# Patient Record
Sex: Female | Born: 1980
Health system: Southern US, Community
[De-identification: ages and names within clinical notes are randomized; demographics above are authoritative.]

---

## 2004-11-08 ENCOUNTER — Emergency Department: Payer: Self-pay | Admitting: Emergency Medicine

## 2005-02-15 ENCOUNTER — Emergency Department: Payer: Self-pay | Admitting: Emergency Medicine

## 2005-10-09 ENCOUNTER — Emergency Department: Payer: Self-pay | Admitting: General Practice

## 2006-10-31 ENCOUNTER — Emergency Department: Payer: Self-pay | Admitting: General Practice

## 2007-07-30 ENCOUNTER — Emergency Department: Payer: Self-pay | Admitting: Emergency Medicine

## 2008-01-07 ENCOUNTER — Emergency Department: Payer: Self-pay | Admitting: Internal Medicine

## 2010-08-08 ENCOUNTER — Emergency Department: Payer: Self-pay | Admitting: Emergency Medicine

## 2012-05-24 ENCOUNTER — Emergency Department: Payer: Self-pay | Admitting: *Deleted

## 2012-05-24 LAB — COMPREHENSIVE METABOLIC PANEL
Albumin: 4 g/dL (ref 3.4–5.0)
Alkaline Phosphatase: 76 U/L (ref 50–136)
Anion Gap: 7 (ref 7–16)
Bilirubin,Total: 0.2 mg/dL (ref 0.2–1.0)
Chloride: 106 mmol/L (ref 98–107)
EGFR (African American): >60
EGFR (Non-African Amer.): >60
SGPT (ALT): 18 U/L (ref 12–78)
Total Protein: 7.6 g/dL (ref 6.4–8.2)

## 2012-05-24 LAB — URINALYSIS, COMPLETE
Bacteria: NONE SEEN
Bilirubin,UR: NEGATIVE
Glucose,UR: NEGATIVE mg/dL
Ketone: NEGATIVE
Nitrite: NEGATIVE
Ph: 5
Protein: NEGATIVE
RBC,UR: 4 /HPF
Specific Gravity: 1.02
Squamous Epithelial: 4
WBC UR: 2 /HPF

## 2012-05-24 LAB — PREGNANCY, URINE: Pregnancy Test, Urine: NEGATIVE m[IU]/mL

## 2012-05-24 LAB — LIPASE, BLOOD: Lipase: 121 U/L

## 2012-05-24 LAB — CBC
HCT: 40.7 % (ref 35.0–47.0)
HGB: 13.5 g/dL (ref 12.0–16.0)
MCH: 29.3 pg (ref 26.0–34.0)
MCHC: 33.2 g/dL (ref 32.0–36.0)
MCV: 88 fL (ref 80–100)
RBC: 4.62 10*6/uL (ref 3.80–5.20)
RDW: 13.3 % (ref 11.5–14.5)

## 2013-03-19 ENCOUNTER — Emergency Department: Payer: Self-pay | Admitting: Emergency Medicine

## 2014-01-12 ENCOUNTER — Emergency Department: Payer: Self-pay | Admitting: Emergency Medicine

## 2014-01-13 ENCOUNTER — Emergency Department: Payer: Self-pay | Admitting: Emergency Medicine
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
## 2016-10-20 ENCOUNTER — Encounter: Payer: Self-pay | Admitting: Emergency Medicine

## 2016-10-20 ENCOUNTER — Emergency Department
Admission: EM | Admit: 2016-10-20 | Discharge: 2016-10-20 | Disposition: A | Discharge status: Home / Self Care | Payer: No Typology Code available for payment source | Attending: Emergency Medicine | Admitting: Emergency Medicine

## 2016-10-20 DIAGNOSIS — Y92410 Unspecified street and highway as the place of occurrence of the external cause: Secondary | ICD-10-CM | POA: Insufficient documentation

## 2016-10-20 DIAGNOSIS — S199XXA Unspecified injury of neck, initial encounter: Secondary | ICD-10-CM | POA: Diagnosis present

## 2016-10-20 DIAGNOSIS — S161XXA Strain of muscle, fascia and tendon at neck level, initial encounter: Secondary | ICD-10-CM

## 2016-10-20 DIAGNOSIS — Y939 Activity, unspecified: Secondary | ICD-10-CM | POA: Insufficient documentation

## 2016-10-20 DIAGNOSIS — Y999 Unspecified external cause status: Secondary | ICD-10-CM | POA: Diagnosis not present

## 2016-10-20 MED ORDER — CYCLOBENZAPRINE HCL 10 MG PO TABS: 10.0000 mg | ORAL_TABLET | Freq: Three times a day (TID) | ORAL | 0 refills | Status: DC | PRN

## 2016-10-20 MED ORDER — IBUPROFEN 600 MG PO TABS: 600.0000 mg | ORAL_TABLET | Freq: Three times a day (TID) | ORAL | 0 refills | Status: DC | PRN

## 2016-10-20 MED ORDER — TRAMADOL HCL 50 MG PO TABS: 50.0000 mg | ORAL_TABLET | Freq: Four times a day (QID) | ORAL | 0 refills | Status: DC | PRN
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
## 2017-04-06 ENCOUNTER — Emergency Department
Admission: EM | Admit: 2017-04-06 | Discharge: 2017-04-06 | Disposition: A | Discharge status: Home / Self Care | Payer: Medicaid Other | Attending: Emergency Medicine | Admitting: Emergency Medicine

## 2017-04-06 ENCOUNTER — Encounter: Payer: Self-pay | Admitting: Emergency Medicine

## 2017-04-06 ENCOUNTER — Emergency Department: Payer: Medicaid Other

## 2017-04-06 DIAGNOSIS — R0789 Other chest pain: Secondary | ICD-10-CM | POA: Diagnosis not present

## 2017-04-06 DIAGNOSIS — Z79899 Other long term (current) drug therapy: Secondary | ICD-10-CM | POA: Diagnosis not present

## 2017-04-06 DIAGNOSIS — F410 Panic disorder [episodic paroxysmal anxiety] without agoraphobia: Secondary | ICD-10-CM | POA: Diagnosis not present

## 2017-04-06 DIAGNOSIS — R079 Chest pain, unspecified: Secondary | ICD-10-CM | POA: Diagnosis present

## 2017-04-06 LAB — BASIC METABOLIC PANEL
ANION GAP: 8 (ref 5–15)
BUN: 15 mg/dL (ref 6–20)
CALCIUM: 9.2 mg/dL (ref 8.9–10.3)
CO2: 23 mmol/L (ref 22–32)
CREATININE: 0.7 mg/dL (ref 0.44–1.00)
Chloride: 104 mmol/L (ref 101–111)
GFR calc non Af Amer: >60 mL/min (ref >60)
Glucose, Bld: 105 mg/dL — ABNORMAL HIGH (ref 65–99)
Potassium: 3.3 mmol/L — ABNORMAL LOW (ref 3.5–5.1)
Sodium: 135 mmol/L (ref 135–145)

## 2017-04-06 LAB — CBC
HCT: 40 % (ref 35.0–47.0)
Hemoglobin: 13.5 g/dL (ref 12.0–16.0)
MCH: 29.7 pg (ref 26.0–34.0)
MCHC: 33.7 g/dL (ref 32.0–36.0)
MCV: 88.1 fL (ref 80.0–100.0)
PLATELETS: 282 10*3/uL (ref 150–440)
RBC: 4.55 MIL/uL (ref 3.80–5.20)
RDW: 13.4 % (ref 11.5–14.5)
WBC: 8.5 10*3/uL (ref 3.6–11.0)

## 2017-04-06 LAB — TROPONIN I

## 2017-04-06 MED ORDER — LORAZEPAM 1 MG PO TABS
1.0000 mg | ORAL_TABLET | Freq: Once | ORAL | Status: AC
  Administered 2017-04-06: 1 mg via ORAL

## 2017-04-06 MED ORDER — LORAZEPAM 1 MG PO TABS
ORAL_TABLET | ORAL | Status: AC
  Filled 2017-04-06: qty 1

## 2017-04-06 MED ORDER — LORAZEPAM 1 MG PO TABS: 1.0000 mg | ORAL_TABLET | Freq: Three times a day (TID) | ORAL | 0 refills | Status: AC | PRN
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
## 2017-05-16 ENCOUNTER — Emergency Department
Admission: EM | Admit: 2017-05-16 | Discharge: 2017-05-16 | Disposition: A | Discharge status: Home / Self Care | Payer: Medicaid Other | Attending: Emergency Medicine | Admitting: Emergency Medicine

## 2017-05-16 ENCOUNTER — Emergency Department: Payer: Medicaid Other

## 2017-05-16 ENCOUNTER — Encounter: Payer: Self-pay | Admitting: *Deleted

## 2017-05-16 DIAGNOSIS — M5441 Lumbago with sciatica, right side: Secondary | ICD-10-CM | POA: Insufficient documentation

## 2017-05-16 DIAGNOSIS — M545 Low back pain: Secondary | ICD-10-CM | POA: Diagnosis present

## 2017-05-16 LAB — URINALYSIS, COMPLETE (UACMP) WITH MICROSCOPIC
BACTERIA UA: NONE SEEN
Bilirubin Urine: NEGATIVE
GLUCOSE, UA: NEGATIVE mg/dL
KETONES UR: NEGATIVE mg/dL
Leukocytes, UA: NEGATIVE
Nitrite: NEGATIVE
PROTEIN: NEGATIVE mg/dL
Specific Gravity, Urine: 1.01 (ref 1.005–1.030)
pH: 7 (ref 5.0–8.0)

## 2017-05-16 LAB — POCT PREGNANCY, URINE: PREG TEST UR: NEGATIVE

## 2017-05-16 MED ORDER — DIAZEPAM 2 MG PO TABS
2.0000 mg | ORAL_TABLET | Freq: Once | ORAL | Status: AC
  Administered 2017-05-16: 2 mg via ORAL
  Filled 2017-05-16: qty 1

## 2017-05-16 MED ORDER — TRAMADOL HCL 50 MG PO TABS
50.0000 mg | ORAL_TABLET | Freq: Once | ORAL | Status: AC
  Administered 2017-05-16: 50 mg via ORAL
  Filled 2017-05-16: qty 1

## 2017-05-16 MED ORDER — KETOROLAC TROMETHAMINE 60 MG/2ML IM SOLN
60.0000 mg | Freq: Once | INTRAMUSCULAR | Status: AC
  Administered 2017-05-16: 60 mg via INTRAMUSCULAR
  Filled 2017-05-16: qty 2

## 2017-05-16 MED ORDER — CYCLOBENZAPRINE HCL 5 MG PO TABS: 5.0000 mg | ORAL_TABLET | Freq: Three times a day (TID) | ORAL | 0 refills | Status: DC | PRN

## 2017-05-16 MED ORDER — LIDOCAINE 5 % EX PTCH
1.0000 | MEDICATED_PATCH | CUTANEOUS | Status: DC
  Administered 2017-05-16: 1 via TRANSDERMAL
  Filled 2017-05-16: qty 1

## 2017-05-16 MED ORDER — ACETAMINOPHEN 325 MG PO TABS
650.0000 mg | ORAL_TABLET | Freq: Once | ORAL | Status: AC
  Administered 2017-05-16: 650 mg via ORAL
  Filled 2017-05-16: qty 2

## 2017-05-16 MED ORDER — NAPROXEN 375 MG PO TABS: 375.0000 mg | ORAL_TABLET | Freq: Two times a day (BID) | ORAL | 0 refills | Status: DC

## 2017-05-16 NOTE — ED Triage Notes (Signed)
Pt complains of low back pain radiating down right buttock, pt denies injury, pain started last night

## 2017-05-16 NOTE — ED Notes (Signed)
Pt resting in bed, resp even and unlabored, family at bedside 

## 2017-05-16 NOTE — ED Provider Notes (Signed)
-----------------------------------------   10:03 AM on 05/16/2017 -----------------------------------------  Patient in no acute distress, neurovascular intact, side at 3 this morning by Dr. Zenda AlpersWebster. Delay care as the patient could not give us a urine sample for pregnancy test prior to x-ray. X-ray is negative exam is reassuring There is no evidence of cauda equina syndrome nothing to suggest that the patient has her for an abdominal pain, acute neurologic deficit, there are no "red flags" for this back pain to suggest any other acute pathology that requires emergent evaluation. Patient feels much more comfortable at this time. We'll discharge her with pain medications, plus follow-up. Return precautions and follow-up given and understood. According to Dr. Leone PayorWebster's plan, urinalysis and x-ray is negative she is to be discharged home. We will do so.   Jeanmarie PlantMcShane, Liany Mumpower A, MD 05/16/17 1005

## 2017-05-16 NOTE — ED Notes (Signed)
pts ride home at bedside

## 2017-05-16 NOTE — ED Provider Notes (Signed)
[facility] Emergency Department Provider Note   ____________________________________________   First MD Initiated Contact with Patient 05/16/17 [phone]     (approximate)  I have reviewed the triage vital signs and the nursing notes.   HISTORY  Chief Complaint Back Pain    HPI Terri Gonzalez is a 36 y.o. female who comes into the hospital today with some back pain.The patient reports that she's had constant pain on and off for 2 weeks. She reports that 2 weeks ago she was bending down to sweep up some dust on the floor and she pulled her back trying to stand up. She reports that since then she's been walking really slow and have a lot of pain whenever she tries to drive her car move or turn. She started taking a muscle relaxer but she reports is not helping. A neighbor gave her a pain pill and it was the only thing that helps her to sleep. The patient was at the beach this weekend and reports that she did okay but the pain came back worse last night. The patient has been unable to sleep. She isn't seen anyone has been trying to do some exercises at home. She is been using icy hot as well. The patient denies any incontinence or urinary retention. She is here today for evaluation. The patient states that the pain is in her bilateral and it goes down to her right lower extremity.   History reviewed. No pertinent past medical history.  There are no active problems to display for this patient.   History reviewed. No pertinent surgical history.  Prior to Admission medications   Medication Sig Start Date End Date Taking? Authorizing Provider  cyclobenzaprine (FLEXERIL) 10 MG tablet Take 1 tablet (10 mg total) by mouth 3 (three) times daily as needed. Patient not taking: Reported on 05/16/2017 10/20/16   Joni ReiningSmith, Ronald K, PA-C  ibuprofen (ADVIL,MOTRIN) 600 MG tablet Take 1 tablet (600 mg total) by mouth every 8 (eight) hours as needed. Patient not taking: Reported on  05/16/2017 10/20/16   Joni ReiningSmith, Ronald K, PA-C  LORazepam (ATIVAN) 1 MG tablet Take 1 tablet (1 mg total) by mouth every 8 (eight) hours as needed for anxiety. Patient not taking: Reported on 05/16/2017 04/06/17 04/06/18  Merrily Brittleifenbark, Neil, MD  traMADol (ULTRAM) 50 MG tablet Take 1 tablet (50 mg total) by mouth every 6 (six) hours as needed. Patient not taking: Reported on 05/16/2017 10/20/16 10/20/17  Joni ReiningSmith, Ronald K, PA-C    Allergies Patient has no known allergies.  No family history on file.  Social History Social History  Substance Use Topics  . Smoking status: Never Smoker  . Smokeless tobacco: Never Used  . Alcohol use Yes    Review of Systems  Constitutional: No fever/chills Eyes: No visual changes. ENT: No sore throat. Cardiovascular: Denies chest pain. Respiratory: Denies shortness of breath. Gastrointestinal: No abdominal pain.  No nausea, no vomiting.  No diarrhea.  No constipation. Genitourinary: Negative for dysuria. Musculoskeletal:  back pain. Skin: Negative for rash. Neurological: Negative for headaches, focal weakness or numbness.   ____________________________________________   PHYSICAL EXAM:  VITAL SIGNS: ED Triage Vitals  Enc Vitals Group     BP 05/16/17 0555 113/85     Pulse Rate 05/16/17 0555 62     Resp 05/16/17 0555 18     Temp 05/16/17 0555 (!) 97.5 F (36.4 C)     Temp Source 05/16/17 0555 Oral     SpO2 05/16/17 0555 100 %  Weight 05/16/17 0550 150 lb (68 kg)     Height 05/16/17 0550 5\' 7"  (1.702 m)     Head Circumference --      Peak Flow --      Pain Score 05/16/17 0550 9     Pain Loc --      Pain Edu? --      Excl. in GC? --     Constitutional: Alert and oriented. Well appearing and in Moderate distress. Eyes: Conjunctivae are normal. PERRL. EOMI. Head: Atraumatic. Nose: No congestion/rhinnorhea. Mouth/Throat: Mucous membranes are moist.  Oropharynx non-erythematous. Cardiovascular: Normal rate, regular rhythm. Grossly normal heart  sounds.  Good peripheral circulation. Respiratory: Normal respiratory effort.  No retractions. Lungs CTAB. Gastrointestinal: Soft and nontender. No distention. Positive bowel sounds Musculoskeletal: Tenderness to palpation of right low back with some positive straight leg raise.   Neurologic:  Normal speech and language.  Skin:  Skin is warm, dry and intact.  Psychiatric: Mood and affect are normal.   ____________________________________________   LABS (all labs ordered are listed, but only abnormal results are displayed)  Labs Reviewed  URINALYSIS, COMPLETE (UACMP) WITH MICROSCOPIC  POC URINE PREG, ED   ____________________________________________  EKG  none ____________________________________________  RADIOLOGY  No results found.  ____________________________________________   PROCEDURES  Procedure(s) performed: None  Procedures  Critical Care performed: No  ____________________________________________   INITIAL IMPRESSION / ASSESSMENT AND PLAN / ED COURSE  Pertinent labs & imaging results that were available during my care of the patient were reviewed by me and considered in my medical decision making (see chart for details).  This is a 36 year old female who comes into the hospital today with back pain. The patient has pain in her butt cheek that goes down her leg. It sounds that the patient has sciatica. I will give the patient a Lidoderm patch, shot of Toradol, Tylenol, Valium and tramadol. I will also send the patient for an x-ray. The patient's care will be signed out to Dr. Alphonzo LemmingsMcShane who will follow-up the results of the x-ray and disposition the patient.      ____________________________________________   FINAL CLINICAL IMPRESSION(S) / ED DIAGNOSES  Final diagnoses:  Acute right-sided low back pain with right-sided sciatica      NEW MEDICATIONS STARTED DURING THIS VISIT:  New Prescriptions   No medications on file     Note:  This document  was prepared using Dragon voice recognition software and may include unintentional dictation errors.    Rebecka ApleyWebster, Vallory Oetken P, MD 05/16/17 [phone]

## 2017-05-27 ENCOUNTER — Emergency Department
Admission: EM | Admit: 2017-05-27 | Discharge: 2017-05-27 | Disposition: A | Discharge status: Home / Self Care | Payer: Medicaid Other | Attending: Student in an Organized Health Care Education/Training Program | Admitting: Student in an Organized Health Care Education/Training Program

## 2017-05-27 ENCOUNTER — Encounter: Payer: Self-pay | Admitting: Emergency Medicine

## 2017-05-27 DIAGNOSIS — M5431 Sciatica, right side: Secondary | ICD-10-CM | POA: Diagnosis not present

## 2017-05-27 DIAGNOSIS — M25551 Pain in right hip: Secondary | ICD-10-CM | POA: Diagnosis present

## 2017-05-27 MED ORDER — PREDNISONE 10 MG (21) PO TBPK: ORAL_TABLET | ORAL | 0 refills | Status: DC

## 2017-05-27 MED ORDER — TRAMADOL HCL 50 MG PO TABS: 50.0000 mg | ORAL_TABLET | Freq: Four times a day (QID) | ORAL | 0 refills | Status: DC | PRN

## 2017-05-27 MED ORDER — METHOCARBAMOL 750 MG PO TABS: 750.0000 mg | ORAL_TABLET | Freq: Three times a day (TID) | ORAL | 0 refills | Status: DC | PRN

## 2017-05-27 NOTE — ED Triage Notes (Signed)
Pt reports right leg pain that shoots down the back of her leg for four weeks. Pt reports history of sciatica.

## 2017-05-27 NOTE — ED Provider Notes (Signed)
[facility] Emergency Department Provider Note ____________________________________________  Time seen: Approximately 2:42 PM  I have reviewed the triage vital signs and the nursing notes.   HISTORY  Chief Complaint Sciatica    HPI Terri Gonzalez is a 36 y.o. female who presents to the emergency department for evaluation of right hip and leg pain that shoots down the back of her leg. Symptoms started while mopping about 4 weeks ago. She has a history of sciatica and current symptoms are the same. She has taken naproxen and flexeril without relief.  History reviewed. No pertinent past medical history.  There are no active problems to display for this patient.   No past surgical history on file.  Prior to Admission medications   Medication Sig Start Date End Date Taking? Authorizing Provider  cyclobenzaprine (FLEXERIL) 5 MG tablet Take 1 tablet (5 mg total) by mouth 3 (three) times daily as needed for muscle spasms. Do not drive on this medication 5/40/987/31/18   Jeanmarie PlantMcShane, James A, MD  ibuprofen (ADVIL,MOTRIN) 600 MG tablet Take 1 tablet (600 mg total) by mouth every 8 (eight) hours as needed. Patient not taking: Reported on 05/16/2017 10/20/16   Joni ReiningSmith, Ronald K, PA-C  LORazepam (ATIVAN) 1 MG tablet Take 1 tablet (1 mg total) by mouth every 8 (eight) hours as needed for anxiety. Patient not taking: Reported on 05/16/2017 04/06/17 04/06/18  Merrily Brittleifenbark, Neil, MD  methocarbamol (ROBAXIN-750) 750 MG tablet Take 1 tablet (750 mg total) by mouth every 8 (eight) hours as needed for muscle spasms. 05/27/17   Normagene Harvie B, FNP  predniSONE (STERAPRED UNI-PAK 21 TAB) 10 MG (21) TBPK tablet Take 6 tablets on day 1 Take 5 tablets on day 2 Take 4 tablets on day 3 Take 3 tablets on day 4 Take 2 tablets on day 5 Take 1 tablet on day 6 05/27/17   Deon Duer B, FNP  traMADol (ULTRAM) 50 MG tablet Take 1 tablet (50 mg total) by mouth every 6 (six) hours as needed. 05/27/17    Chinita Pesterriplett, Letizia Hook B, FNP    Allergies Patient has no known allergies.  No family history on file.  Social History Social History  Substance Use Topics  . Smoking status: Never Smoker  . Smokeless tobacco: Never Used  . Alcohol use Yes    Review of Systems Constitutional: Well appearing. Cardiovascular: Negative for change in skin temperature or color. Respiratory: Negative for dyspnea. Musculoskeletal:   Negative for fecal incontinence,  Saddle anesthesia, or urinary retention  Negative for immunosuppression, IV drug use, or fever  Negative for chronic steroid use   Negative for trauma in the presence of osteoporosis  Negative for age over 5850 and trauma.  Negative for constitutional symptoms, or history of cancer.  Negative for pain worse at night.  Negative for focal neurologic deficit, progressive, or disabling symptoms Skin: Negative for rash, lesion, or wound.  Neurological: Positive for burning, tingling, numb, electric, radiating pain in the right lower extremity..  ____________________________________________   PHYSICAL EXAM:  VITAL SIGNS: ED Triage Vitals  Enc Vitals Group     BP 05/27/17 0935 122/79     Pulse Rate 05/27/17 0935 82     Resp 05/27/17 0935 18     Temp 05/27/17 0935 98.4 F (36.9 C)     Temp Source 05/27/17 0935 Oral     SpO2 05/27/17 0935 96 %     Weight 05/27/17 0934 150 lb (68 kg)     Height 05/27/17 0934 5'  7" (1.702 m)     Head Circumference --      Peak Flow --      Pain Score 05/27/17 0934 7     Pain Loc --      Pain Edu? --      Excl. in GC? --     Constitutional: Alert and oriented. Well appearing and in no acute distress. Eyes: Conjunctivae are clear without discharge or drainage.  Head: Atraumatic. Neck: Full, active range of motion. Respiratory: Respirations even and unlabored. Musculoskeletal: Full ROM, Strength 5/5 of the lower extremities as tested. Neurologic: Reflexes of the lower extremities are 2+. Positive straight  leg raise on the right side. Skin: Atraumatic.  Psychiatric: Behavior and affect are normal.  ____________________________________________   LABS (all labs ordered are listed, but only abnormal results are displayed)  Labs Reviewed - No data to display ____________________________________________  RADIOLOGY  Not indicated. ____________________________________________   PROCEDURES  Procedure(s) performed: None  ____________________________________________   INITIAL IMPRESSION / ASSESSMENT AND PLAN / ED COURSE  Terri Arbour is a 36 y.o. female who presents to the emergency department for treatment of symptoms most consistent with sciatica. She will be given a prednisone taper, robaxin, and tramadol.  Patient was advised to follow up with orthopedics in 1 week if not improving or return to the ER for symptoms that change or worsen if unable to schedule an appointment.  Pertinent labs & imaging results that were available during my care of the patient were reviewed by me and considered in my medical decision making (see chart for details).  _________________________________________   FINAL CLINICAL IMPRESSION(S) / ED DIAGNOSES  Final diagnoses:  Sciatica of right side    Discharge Medication List as of 05/27/2017 10:07 AM    START taking these medications   Details  methocarbamol (ROBAXIN-750) 750 MG tablet Take 1 tablet (750 mg total) by mouth every 8 (eight) hours as needed for muscle spasms., Starting Sat 05/27/2017, Print    predniSONE (STERAPRED UNI-PAK 21 TAB) 10 MG (21) TBPK tablet Take 6 tablets on day 1 Take 5 tablets on day 2 Take 4 tablets on day 3 Take 3 tablets on day 4 Take 2 tablets on day 5 Take 1 tablet on day 6, Print        If controlled substance prescribed during this visit, 12 month history viewed [place] the [place] prior to issuing an initial prescription for Schedule II or III opiod.    Chinita Pester, FNP 05/28/17 1610     Willy Eddy, MD 05/28/17 [phone]

## 2018-02-05 ENCOUNTER — Emergency Department
Admission: EM | Admit: 2018-02-05 | Discharge: 2018-02-05 | Disposition: A | Discharge status: Home / Self Care | Payer: Medicaid Other | Attending: Emergency Medicine | Admitting: Emergency Medicine

## 2018-02-05 ENCOUNTER — Encounter: Payer: Self-pay | Admitting: Emergency Medicine

## 2018-02-05 ENCOUNTER — Other Ambulatory Visit: Payer: Self-pay

## 2018-02-05 DIAGNOSIS — R112 Nausea with vomiting, unspecified: Secondary | ICD-10-CM | POA: Diagnosis not present

## 2018-02-05 DIAGNOSIS — R109 Unspecified abdominal pain: Secondary | ICD-10-CM | POA: Insufficient documentation

## 2018-02-05 DIAGNOSIS — Z5321 Procedure and treatment not carried out due to patient leaving prior to being seen by health care provider: Secondary | ICD-10-CM | POA: Diagnosis not present

## 2018-02-05 LAB — COMPREHENSIVE METABOLIC PANEL
ALBUMIN: 4.5 g/dL (ref 3.5–5.0)
ALT: 21 U/L (ref 14–54)
ANION GAP: 7 (ref 5–15)
AST: 25 U/L (ref 15–41)
Alkaline Phosphatase: 66 U/L (ref 38–126)
BUN: 10 mg/dL (ref 6–20)
CHLORIDE: 105 mmol/L (ref 101–111)
CO2: 23 mmol/L (ref 22–32)
CREATININE: 0.54 mg/dL (ref 0.44–1.00)
Calcium: 8.7 mg/dL — ABNORMAL LOW (ref 8.9–10.3)
GFR calc Af Amer: >60 mL/min (ref >60)
GFR calc non Af Amer: >60 mL/min (ref >60)
GLUCOSE: 97 mg/dL (ref 65–99)
Potassium: 3.5 mmol/L (ref 3.5–5.1)
SODIUM: 135 mmol/L (ref 135–145)
Total Bilirubin: 0.9 mg/dL (ref 0.3–1.2)
Total Protein: 7.4 g/dL (ref 6.5–8.1)

## 2018-02-05 LAB — CBC
HCT: 42.8 % (ref 35.0–47.0)
HEMOGLOBIN: 14.6 g/dL (ref 12.0–16.0)
MCH: 29.4 pg (ref 26.0–34.0)
MCHC: 34.1 g/dL (ref 32.0–36.0)
MCV: 86.3 fL (ref 80.0–100.0)
Platelets: 319 10*3/uL (ref 150–440)
RBC: 4.96 MIL/uL (ref 3.80–5.20)
RDW: 13.6 % (ref 11.5–14.5)
WBC: 9.6 10*3/uL (ref 3.6–11.0)

## 2018-02-05 LAB — URINALYSIS, COMPLETE (UACMP) WITH MICROSCOPIC
Bilirubin Urine: NEGATIVE
Glucose, UA: NEGATIVE mg/dL
Ketones, ur: 20 mg/dL — AB
Nitrite: NEGATIVE
PROTEIN: NEGATIVE mg/dL
Specific Gravity, Urine: 1.024 (ref 1.005–1.030)
pH: 7 (ref 5.0–8.0)

## 2018-02-05 LAB — POCT PREGNANCY, URINE: Preg Test, Ur: NEGATIVE

## 2018-02-05 LAB — LIPASE, BLOOD: Lipase: 23 U/L (ref 11–51)

## 2018-02-05 NOTE — ED Notes (Signed)
Called from lobby to room, no anwser

## 2018-02-05 NOTE — ED Notes (Signed)
Pt called for 2nd time from lobby to room, no answer

## 2018-02-05 NOTE — ED Triage Notes (Addendum)
Pt to triage via w/c with no distress noted; Pt reports eating [employer] last night; since this am having abd cramping & diarrhea; then ate hotdogs today that were expired 4/1 and began having N/V; no further diarrhea

## 2019-05-22 IMAGING — CR DG LUMBAR SPINE 2-3V
3 series · 3 of 3 positions shown · non-contrast
Comparison: None.

CLINICAL DATA: Right-sided low back pain.  SI joint pain.

EXAM:
LUMBAR SPINE - 2-3 VIEW

[l-spine ap]
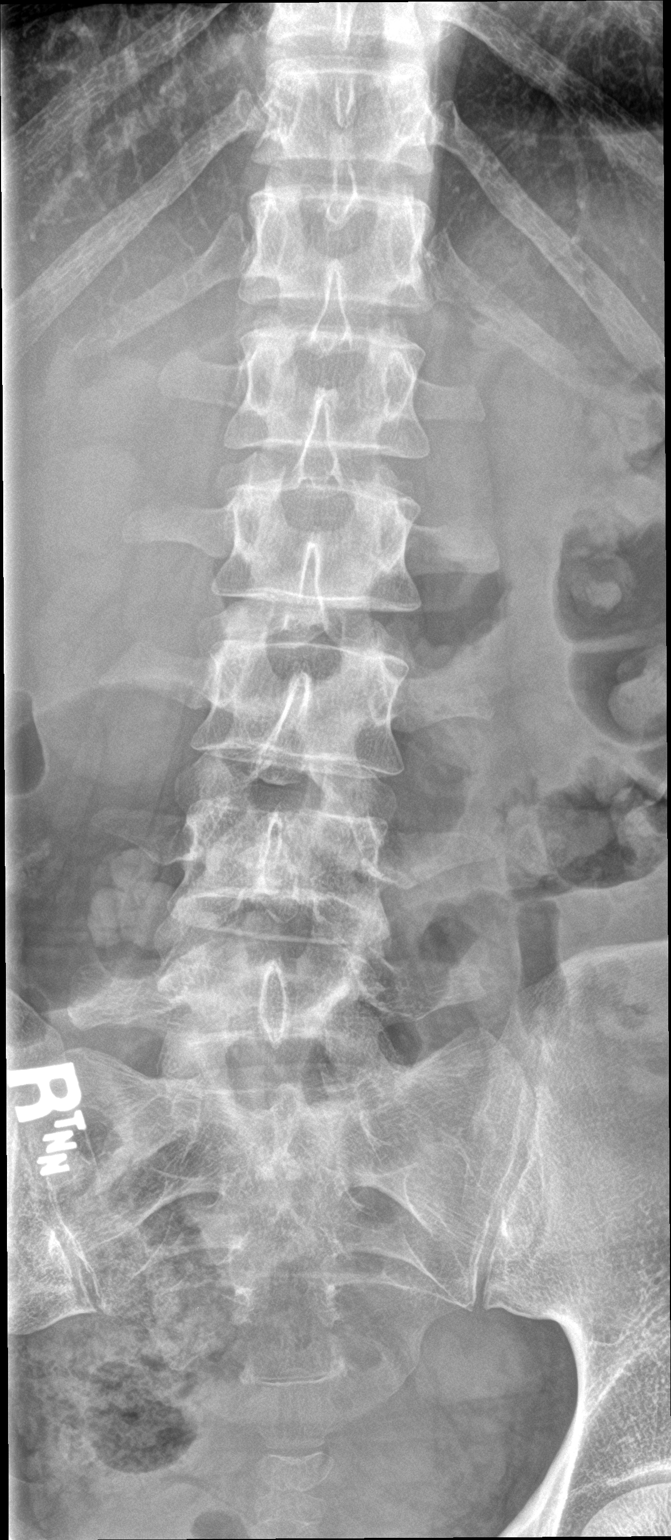

[l-spine lat]
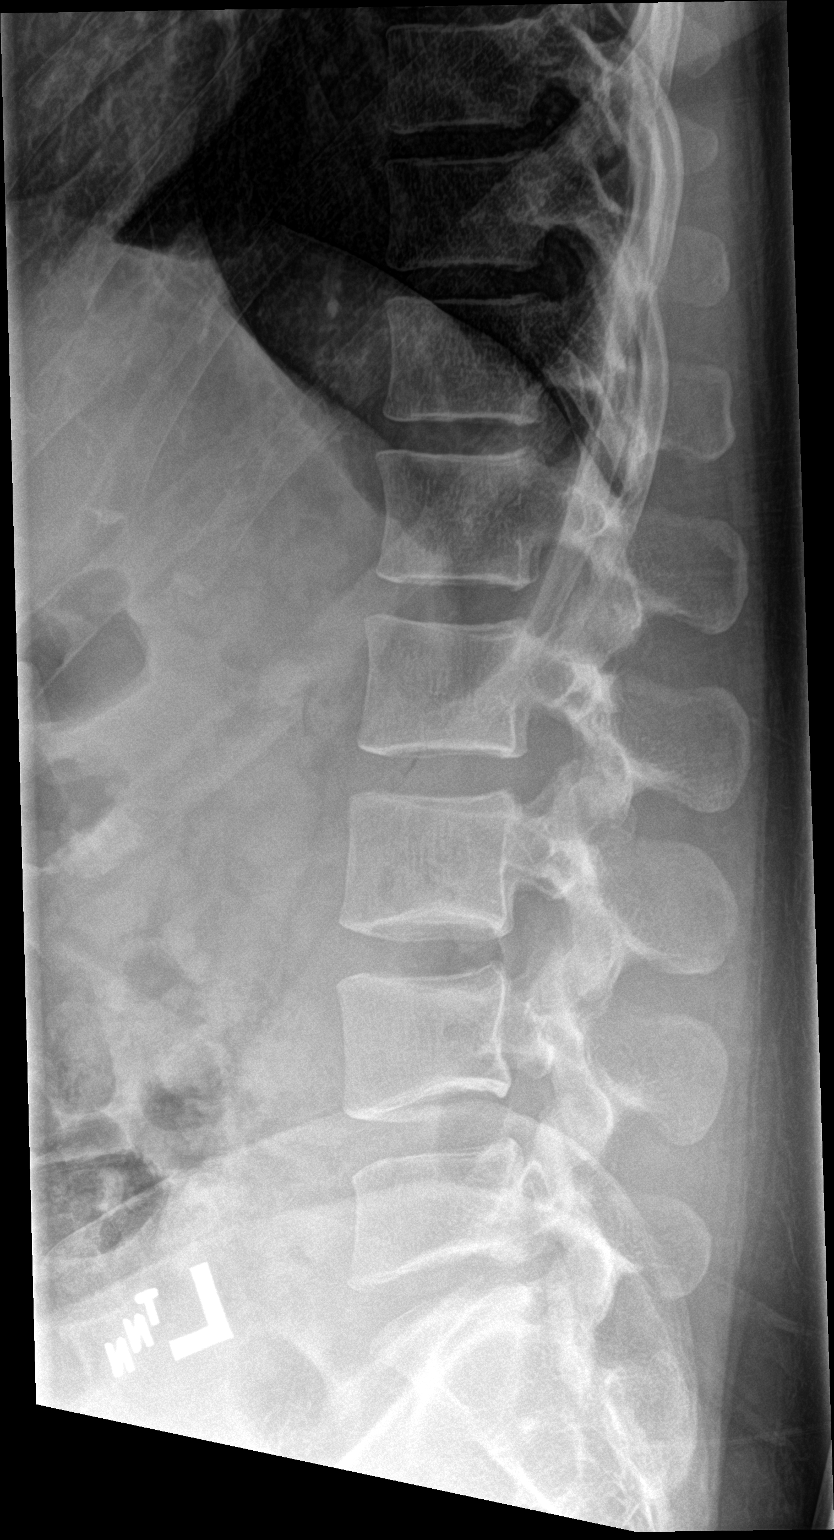

[l-spine spot]
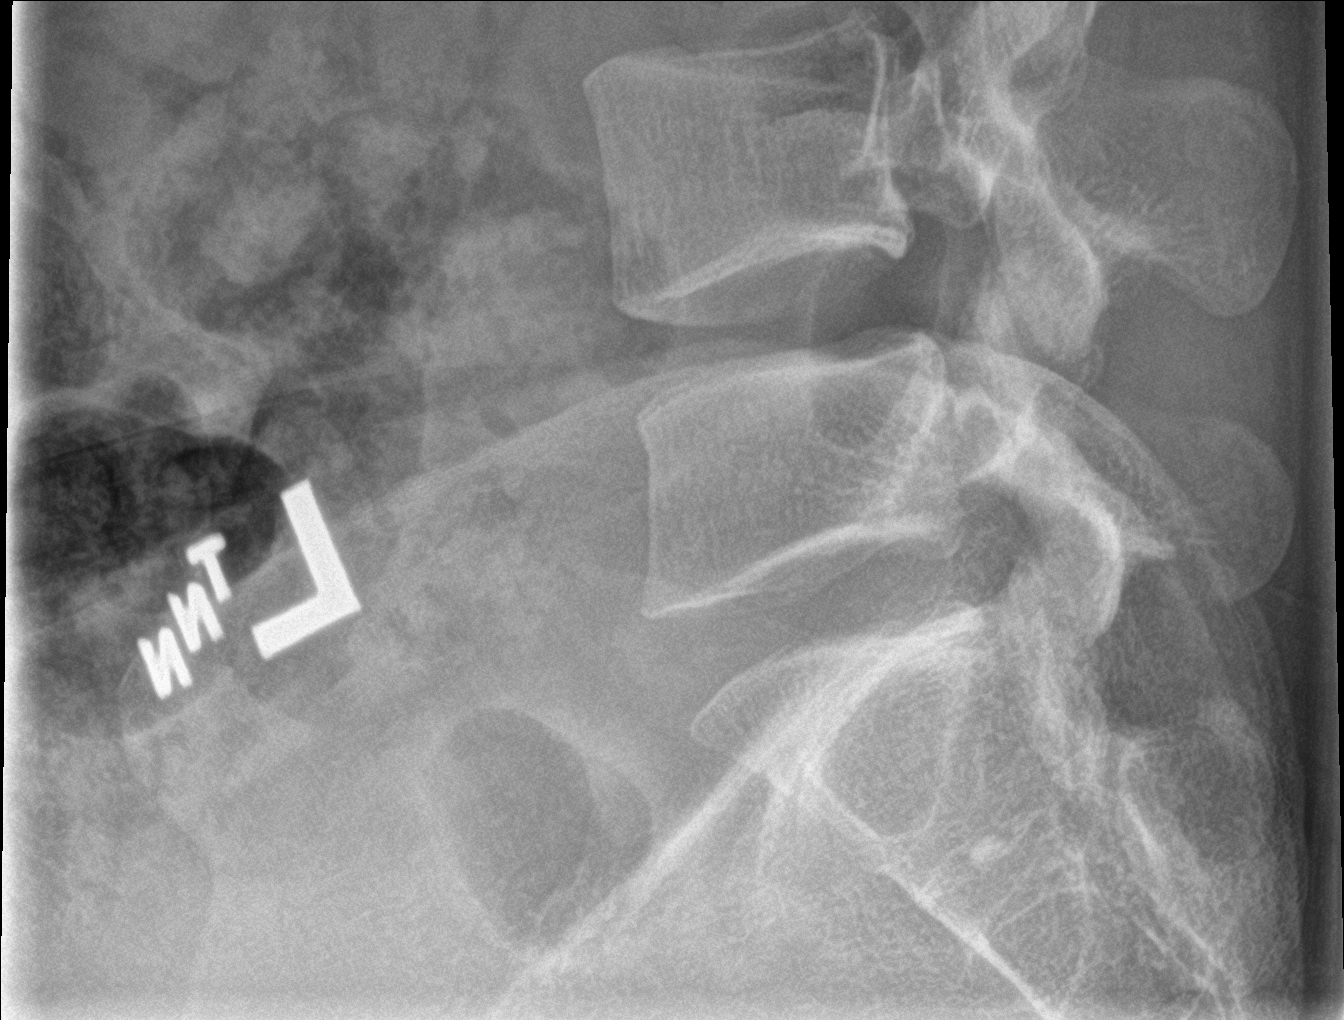

[3 of 3 positions shown; findings below may reference images not displayed]

FINDINGS: Five non rib-bearing lumbar type vertebral bodies are present.
Vertebral body heights are maintained. Alignment is anatomic. There
is straightening of the normal lumbar lordosis. Soft tissues are
unremarkable.
IMPRESSION: 1. No acute fracture traumatic subluxation.
2. Straightening of the normal lumbar lordosis. This is nonspecific,
but can be seen in the setting of muscle strain or ongoing pain.

## 2019-10-23 ENCOUNTER — Ambulatory Visit: Payer: Medicaid Other | Attending: Internal Medicine

## 2019-10-23 DIAGNOSIS — Z20822 Contact with and (suspected) exposure to covid-19: Secondary | ICD-10-CM | POA: Insufficient documentation

## 2019-10-24 ENCOUNTER — Emergency Department
Admission: EM | Admit: 2019-10-24 | Discharge: 2019-10-24 | Disposition: A | Discharge status: Home / Self Care | Payer: Medicaid Other | Attending: Emergency Medicine | Admitting: Emergency Medicine

## 2019-10-24 ENCOUNTER — Other Ambulatory Visit: Payer: Self-pay

## 2019-10-24 DIAGNOSIS — Y9389 Activity, other specified: Secondary | ICD-10-CM | POA: Insufficient documentation

## 2019-10-24 DIAGNOSIS — M7918 Myalgia, other site: Secondary | ICD-10-CM | POA: Insufficient documentation

## 2019-10-24 DIAGNOSIS — Y92410 Unspecified street and highway as the place of occurrence of the external cause: Secondary | ICD-10-CM | POA: Diagnosis not present

## 2019-10-24 DIAGNOSIS — Z720 Tobacco use: Secondary | ICD-10-CM | POA: Diagnosis not present

## 2019-10-24 DIAGNOSIS — Y999 Unspecified external cause status: Secondary | ICD-10-CM | POA: Insufficient documentation

## 2019-10-24 LAB — NOVEL CORONAVIRUS, NAA: SARS-CoV-2, NAA: NOT DETECTED

## 2019-10-24 MED ORDER — IBUPROFEN 600 MG PO TABS: 600.0000 mg | ORAL_TABLET | Freq: Four times a day (QID) | ORAL | 0 refills | Status: AC | PRN

## 2019-10-24 MED ORDER — KETOROLAC TROMETHAMINE 30 MG/ML IJ SOLN
30.0000 mg | Freq: Once | INTRAMUSCULAR | Status: AC
  Administered 2019-10-24: 10:00:00 30 mg via INTRAMUSCULAR
  Filled 2019-10-24: qty 1

## 2019-10-24 MED ORDER — CYCLOBENZAPRINE HCL 5 MG PO TABS: ORAL_TABLET | ORAL | 0 refills | Status: AC
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
## 2021-07-14 ENCOUNTER — Emergency Department (HOSPITAL_COMMUNITY)
Admission: EM | Admit: 2021-07-14 | Discharge: 2021-07-14 | Disposition: A | Discharge status: Home / Self Care | Payer: BC Managed Care – PPO | Attending: Emergency Medicine | Admitting: Emergency Medicine

## 2021-07-14 ENCOUNTER — Encounter (HOSPITAL_COMMUNITY): Payer: Self-pay | Admitting: *Deleted

## 2021-07-14 DIAGNOSIS — F17200 Nicotine dependence, unspecified, uncomplicated: Secondary | ICD-10-CM | POA: Diagnosis not present

## 2021-07-14 DIAGNOSIS — L03011 Cellulitis of right finger: Secondary | ICD-10-CM | POA: Insufficient documentation

## 2021-07-14 MED ORDER — AMOXICILLIN-POT CLAVULANATE 875-125 MG PO TABS: 1.0000 | ORAL_TABLET | Freq: Two times a day (BID) | ORAL | 0 refills | Status: AC

## 2021-07-14 NOTE — Discharge Instructions (Signed)
Stop taking keflex, start taking the augmentin until it is completely gone  Please follow up with your primary care provider within 5-7 days for re-evaluation of your symptoms. If you do not have a primary care provider, information for a healthcare clinic has been provided for you to make arrangements for follow up care. Please return to the emergency department for any new or worsening symptoms.

## 2021-07-14 NOTE — ED Notes (Signed)
Pt ambulatory in ED lobby. 

## 2021-07-14 NOTE — ED Triage Notes (Signed)
Pt complains of right hand, left middle finger pain and swelling. She was prescribed abx 3 days ago, has not improved.

## 2021-07-14 NOTE — ED Provider Notes (Addendum)
[facility]-EMERGENCY DEPT Provider Note   CSN: [id number] Arrival date & time: 07/14/21  1225     History Chief Complaint  Patient presents with   Wound Infection    Terri Gonzalez is a 40 y.o. female.  HPI   40 y/o female presents to the Ed today for eval of a wound to the right middle finger.  States that she had a hangnail to the finger and pulled it.  She later developed swelling and redness to the finger.  She was placed on Keflex but symptoms have not improved.  She does not report any systemic symptoms or fevers.  History reviewed. No pertinent past medical history.  There are no problems to display for this patient.   History reviewed. No pertinent surgical history.   OB History   No obstetric history on file.     No family history on file.  Social History   Tobacco Use   Smoking status: Some Days   Smokeless tobacco: Never  Substance Use Topics   Alcohol use: Yes   Drug use: Yes    Types: Marijuana    Home Medications Prior to Admission medications   Medication Sig Start Date End Date Taking? Authorizing Provider  amoxicillin-clavulanate (AUGMENTIN) 875-125 MG tablet Take 1 tablet by mouth 2 (two) times daily for 7 days. 07/14/21 07/21/21 Yes Cailean Heacock S, PA-C  cyclobenzaprine (FLEXERIL) 5 MG tablet Take 1-2 tablets 3 times daily as needed 10/24/19   Enid Derry, PA-C  ibuprofen (ADVIL) 600 MG tablet Take 1 tablet (600 mg total) by mouth every 6 (six) hours as needed. 10/24/19   Enid Derry, PA-C    Allergies    Patient has no known allergies.  Review of Systems   Review of Systems  Constitutional:  Negative for fever.  Musculoskeletal:        Right middle finger pain  Skin:  Positive for color change.   Physical Exam Updated Vital Signs BP (!) 141/85 (BP Location: Left Arm)   Pulse 81   Temp 97.7 F (36.5 C) (Oral)   Resp 18   Ht 5\' 7"  (1.702 m)   Wt 72.6 kg   SpO2 99%   BMI 25.06 kg/m   Physical  Exam Vitals and nursing note reviewed.  Constitutional:      General: She is not in acute distress.    Appearance: She is well-developed.  HENT:     Head: Normocephalic and atraumatic.  Eyes:     Conjunctiva/sclera: Conjunctivae normal.  Cardiovascular:     Rate and Rhythm: Normal rate.  Pulmonary:     Effort: Pulmonary effort is normal.  Musculoskeletal:        General: Normal range of motion.     Cervical back: Neck supple.     Comments: Paronychia to the right middle finger.  Skin:    General: Skin is warm and dry.  Neurological:     Mental Status: She is alert.    ED Results / Procedures / Treatments   Labs (all labs ordered are listed, but only abnormal results are displayed) Labs Reviewed - No data to display  EKG None  Radiology No results found.  Procedures . Incision and Drainage  Date/Time: 07/14/2021 4:33 PM Performed by: 07/16/2021, PA-C Authorized by: Karrie Meres, PA-C   Consent:    Consent obtained:  Verbal   Consent given by:  Patient   Risks discussed:  Bleeding, pain and incomplete drainage   Alternatives discussed:  No treatment Universal protocol:    Procedure explained and questions answered to patient or proxy's satisfaction: yes     Immediately prior to procedure, a time out was called: yes     Patient identity confirmed:  Verbally with patient Location:    Type:  Abscess   Size:  .5cm   Location: finger. Pre-procedure details:    Skin preparation:  Povidone-iodine Sedation:    Sedation type:  None Anesthesia:    Anesthesia method:  Topical application   Topical anesthesia: pain ez spray. Procedure type:    Complexity:  Simple Procedure details:    Ultrasound guidance: no     Needle aspiration: no     Incision types:  Stab incision   Incision depth:  Dermal   Drainage:  Purulent   Drainage amount:  Scant   Wound treatment:  Wound left open   Packing materials:  None Post-procedure details:    Procedure  completion:  Tolerated   Medications Ordered in ED Medications - No data to display  ED Course  I have reviewed the triage vital signs and the nursing notes.  Pertinent labs & imaging results that were available during my care of the patient were reviewed by me and considered in my medical decision making (see chart for details).    MDM Rules/Calculators/A&P                          40 year old female presents for evaluation of a paronychia to the right middle finger.  The paronychia was incised and drained.  This was successful and patient tolerated the procedure well.  I changed her antibiotics from Keflex to Augmentin.  I advised warm soaks at home and wound care.  Advised on follow-up and return precautions.  She voices understanding of the plan and reasons to return.  All questions answered.  Patient stable for discharge.   Final Clinical Impression(s) / ED Diagnoses Final diagnoses:  Paronychia of right middle finger    Rx / DC Orders ED Discharge Orders          Ordered    amoxicillin-clavulanate (AUGMENTIN) 875-125 MG tablet  2 times daily        07/14/21 [address], [place], PA-C 07/14/21 1631    Karrie Meres, PA-C 07/14/21 1634    Cheryll Cockayne, MD 07/21/21 1657

## 2022-09-01 ENCOUNTER — Other Ambulatory Visit: Payer: Self-pay | Admitting: Neurology

## 2022-09-01 DIAGNOSIS — R202 Paresthesia of skin: Secondary | ICD-10-CM

## 2022-09-06 ENCOUNTER — Ambulatory Visit
Admission: RE | Admit: 2022-09-06 | Discharge: 2022-09-06 | Disposition: A | Discharge status: Home / Self Care | Payer: Managed Care, Other (non HMO) | Source: Ambulatory Visit | Attending: Neurology | Admitting: Neurology

## 2022-09-06 DIAGNOSIS — R202 Paresthesia of skin: Secondary | ICD-10-CM

## 2022-09-06 MED ORDER — GADOBENATE DIMEGLUMINE 529 MG/ML IV SOLN
15.0000 mL | Freq: Once | INTRAVENOUS | Status: AC | PRN
  Administered 2022-09-06: 15 mL via INTRAVENOUS

## 2022-10-05 ENCOUNTER — Ambulatory Visit: Payer: Managed Care, Other (non HMO)

## 2022-10-19 ENCOUNTER — Ambulatory Visit: Payer: Managed Care, Other (non HMO)

## 2022-10-26 ENCOUNTER — Ambulatory Visit: Payer: Managed Care, Other (non HMO)

## 2023-11-28 ENCOUNTER — Other Ambulatory Visit: Payer: Self-pay | Admitting: Obstetrics

## 2023-11-28 DIAGNOSIS — B9689 Other specified bacterial agents as the cause of diseases classified elsewhere: Secondary | ICD-10-CM | POA: Diagnosis not present

## 2023-11-28 DIAGNOSIS — N898 Other specified noninflammatory disorders of vagina: Secondary | ICD-10-CM | POA: Diagnosis not present

## 2023-11-28 DIAGNOSIS — Z1231 Encounter for screening mammogram for malignant neoplasm of breast: Secondary | ICD-10-CM

## 2023-11-28 DIAGNOSIS — N946 Dysmenorrhea, unspecified: Secondary | ICD-10-CM | POA: Diagnosis not present

## 2023-12-13 ENCOUNTER — Ambulatory Visit
Admission: RE | Admit: 2023-12-13 | Discharge: 2023-12-13 | Disposition: A | Discharge status: Home / Self Care | Payer: 59 | Source: Ambulatory Visit | Attending: Obstetrics | Admitting: Obstetrics

## 2023-12-13 DIAGNOSIS — Z1231 Encounter for screening mammogram for malignant neoplasm of breast: Secondary | ICD-10-CM | POA: Diagnosis not present

## 2024-04-09 ENCOUNTER — Other Ambulatory Visit: Payer: Self-pay | Admitting: Medical Genetics

## 2024-04-26 ENCOUNTER — Other Ambulatory Visit (HOSPITAL_COMMUNITY)
Admission: RE | Admit: 2024-04-26 | Discharge: 2024-04-26 | Disposition: A | Discharge status: Home / Self Care | Payer: Self-pay | Source: Ambulatory Visit | Attending: Oncology | Admitting: Oncology

## 2024-05-06 LAB — GENECONNECT MOLECULAR SCREEN: Genetic Analysis Overall Interpretation: NEGATIVE
